# Patient Record
Sex: Male | Born: 1975
Health system: Southern US, Community
[De-identification: ages and names within clinical notes are randomized; demographics above are authoritative.]

---

## 2003-03-07 ENCOUNTER — Encounter: Payer: Self-pay | Admitting: Internal Medicine

## 2003-03-07 ENCOUNTER — Ambulatory Visit (HOSPITAL_COMMUNITY): Admission: RE | Admit: 2003-03-07 | Discharge: 2003-03-07 | Payer: Self-pay | Admitting: Internal Medicine

## 2003-04-03 ENCOUNTER — Emergency Department (HOSPITAL_COMMUNITY): Admission: EM | Admit: 2003-04-03 | Discharge: 2003-04-03 | Payer: Self-pay | Admitting: *Deleted

## 2006-04-01 ENCOUNTER — Ambulatory Visit (HOSPITAL_COMMUNITY): Admission: EM | Admit: 2006-04-01 | Discharge: 2006-04-01 | Payer: Self-pay | Admitting: Emergency Medicine

## 2011-12-23 ENCOUNTER — Other Ambulatory Visit: Payer: Self-pay

## 2011-12-23 ENCOUNTER — Encounter (HOSPITAL_COMMUNITY): Payer: Self-pay | Admitting: Emergency Medicine

## 2011-12-23 ENCOUNTER — Emergency Department (HOSPITAL_COMMUNITY)
Admission: EM | Admit: 2011-12-23 | Discharge: 2011-12-23 | Disposition: A | Discharge status: Home / Self Care | Payer: BC Managed Care – PPO | Attending: Emergency Medicine | Admitting: Emergency Medicine

## 2011-12-23 DIAGNOSIS — K5289 Other specified noninfective gastroenteritis and colitis: Secondary | ICD-10-CM | POA: Insufficient documentation

## 2011-12-23 DIAGNOSIS — R197 Diarrhea, unspecified: Secondary | ICD-10-CM | POA: Insufficient documentation

## 2011-12-23 DIAGNOSIS — K529 Noninfective gastroenteritis and colitis, unspecified: Secondary | ICD-10-CM

## 2011-12-23 DIAGNOSIS — R000 Tachycardia, unspecified: Secondary | ICD-10-CM | POA: Insufficient documentation

## 2011-12-23 DIAGNOSIS — R112 Nausea with vomiting, unspecified: Secondary | ICD-10-CM

## 2011-12-23 LAB — URINALYSIS, ROUTINE W REFLEX MICROSCOPIC
Hgb urine dipstick: NEGATIVE
Leukocytes, UA: NEGATIVE
Nitrite: NEGATIVE
Protein, ur: NEGATIVE mg/dL
Specific Gravity, Urine: 1.015 (ref 1.005–1.030)
Urobilinogen, UA: 0.2 mg/dL (ref 0.0–1.0)

## 2011-12-23 LAB — COMPREHENSIVE METABOLIC PANEL
AST: 20 U/L (ref 0–37)
BUN: 17 mg/dL (ref 6–23)
CO2: 25 mEq/L (ref 19–32)
Calcium: 9.5 mg/dL (ref 8.4–10.5)
Creatinine, Ser: 0.98 mg/dL (ref 0.50–1.35)
GFR calc Af Amer: >90 mL/min (ref >90)
GFR calc non Af Amer: >90 mL/min (ref >90)
Glucose, Bld: 120 mg/dL — ABNORMAL HIGH (ref 70–99)

## 2011-12-23 LAB — CBC
MCH: 30.6 pg (ref 26.0–34.0)
MCV: 87.1 fL (ref 78.0–100.0)
Platelets: 219 10*3/uL (ref 150–400)
RBC: 5.26 MIL/uL (ref 4.22–5.81)

## 2011-12-23 LAB — LIPASE, BLOOD: Lipase: 18 U/L (ref 11–59)

## 2011-12-23 MED ORDER — ONDANSETRON HCL 4 MG/2ML IJ SOLN
4.0000 mg | Freq: Once | INTRAMUSCULAR | Status: AC
  Administered 2011-12-23: 4 mg via INTRAVENOUS
  Filled 2011-12-23: qty 2

## 2011-12-23 MED ORDER — KETOROLAC TROMETHAMINE 30 MG/ML IJ SOLN
30.0000 mg | Freq: Once | INTRAMUSCULAR | Status: AC
  Administered 2011-12-23: 30 mg via INTRAVENOUS
  Filled 2011-12-23: qty 1

## 2011-12-23 MED ORDER — LOPERAMIDE HCL 2 MG PO CAPS
ORAL_CAPSULE | ORAL | Status: AC
  Filled 2011-12-23: qty 2

## 2011-12-23 MED ORDER — LOPERAMIDE HCL 2 MG PO CAPS
4.0000 mg | ORAL_CAPSULE | Freq: Once | ORAL | Status: AC
  Administered 2011-12-23: 4 mg via ORAL

## 2011-12-23 MED ORDER — SODIUM CHLORIDE 0.9 % IV BOLUS (SEPSIS)
1000.0000 mL | Freq: Once | INTRAVENOUS | Status: AC
  Administered 2011-12-23: 1000 mL via INTRAVENOUS

## 2011-12-23 MED ORDER — SODIUM CHLORIDE 0.9 % IV BOLUS (SEPSIS): 1000.0000 mL | Freq: Once | INTRAVENOUS | Status: DC

## 2011-12-23 MED ORDER — ACETAMINOPHEN 325 MG PO TABS
650.0000 mg | ORAL_TABLET | Freq: Once | ORAL | Status: AC
  Administered 2011-12-23: 650 mg via ORAL
  Filled 2011-12-23: qty 2

## 2011-12-23 NOTE — ED Provider Notes (Signed)
History     CSN: [id number]  Arrival date & time 12/23/11  1125   First MD Initiated Contact with Patient 12/23/11 1305      Chief Complaint  Patient presents with  . Abdominal Pain  . Nausea  . Emesis  . Loss of Consciousness    (Consider location/radiation/quality/duration/timing/severity/associated sxs/prior treatment) HPI Patient presents with complaint of nausea vomiting and diarrhea. He states that the symptoms began acutely approximately 4 AM today. He had been feeling otherwise well yesterday until acute onset of his symptoms. He denies fever or chills. Emesis was nonbloody and nonbilious and stool is without blood. He states he has had output approximately every 15 minutes. He does describe a fainting episode in the bathroom which resolved spontaneously. He has had no chest pain. He has no specific abdominal pain but generalized discomfort temporarily relieved by vomiting or passing stool. He's had no recent travel or specific sick contacts. He has not been able to keep down any fluids by mouth today. There no other associated systemic symptoms. There no alleviating or modifying factors.  History reviewed. No pertinent past medical history.  History reviewed. No pertinent past surgical history.  No family history on file.  History  Substance Use Topics  . Smoking status: Never Smoker   . Smokeless tobacco: Not on file  . Alcohol Use:      socially      Review of Systems ROS reviewed and otherwise negative except for mentioned in HPI  Allergies  Penicillins  Home Medications  No current outpatient prescriptions on file.  BP 106/61  Pulse 104  Temp(Src) 99.5 F (37.5 C) (Oral)  Resp 18  SpO2 98% Vitals reviewed Physical Exam Physical Examination: General appearance - alert, concerned appearing, and in no distress Mental status - alert, oriented to person, place, and time Eyes - pupils equal and reactive, extraocular eye movements intact Mouth - mucous  membranes moist, pharynx normal without lesions Chest - clear to auscultation, no wheezes, rales or rhonchi, symmetric air entry Heart - normal rate, regular rhythm, normal S1, S2, no murmurs, rubs, clicks or gallops Abdomen - soft, nontender, nondistended, no masses or organomegaly, nabs Extremities - peripheral pulses normal, no pedal edema, no clubbing or cyanosis Skin - normal coloration and turgor, no rashes, brisk cap refill  ED Course  Procedures (including critical care time)  Date: 12/23/2011  Rate: 120  Rhythm: sinus tachycardia  QRS Axis: normal  Intervals: normal  ST/T Wave abnormalities: normal  Conduction Disutrbances:none  Narrative Interpretation:   Old EKG Reviewed: unchanged compared to EKG of 04/01/06  4:14 PM pt has completed 2 liters of NS, he has had no further vomiting in ED. However, continues to have nausea and tachycardia.  BP is stable.  3rd liter NS ordered, repeat zofran, and tylenol for low grade fever.  Will also add toradol.      Labs Reviewed  URINALYSIS, ROUTINE W REFLEX MICROSCOPIC - Abnormal; Notable for the following:    Ketones, ur TRACE (*)    All other components within normal limits  CBC - Abnormal; Notable for the following:    WBC 15.0 (*)    All other components within normal limits  COMPREHENSIVE METABOLIC PANEL - Abnormal; Notable for the following:    Glucose, Bld 120 (*)    All other components within normal limits  LIPASE, BLOOD  LAB REPORT - SCANNED   No results found.   1. Nausea vomiting and diarrhea   2. Gastroenteritis  MDM  Pt presenting with acute onset of nausea/vomiting/diarrhea.  Pt with tachycardia but otherwise nontoxic in appearance on exam.  Pt hydrated with IV fluid boluses, given zofran.  Labs reassuring with the exception of mild increased WBC.  Plan for continued hydration, antiemetics and reassessment to ensure tachycardia is improved prior to discharge.         Ethelda Chick, MD 12/24/11  [phone]

## 2011-12-23 NOTE — ED Provider Notes (Signed)
  Physical Exam  BP 108/69  Pulse 112  Temp(Src) 100.7 F (38.2 C) (Oral)  Resp 18  SpO2 97%  Physical Exam  ED Course  Procedures  MDM  6:38 PM  HR is improved now to 112, temp still slightly up.  No further vomiting, feels improved overally, still having sensation to have diarrhea, still a lot of diarrhea.  Will try imodium and give another liter of IVF's.      abd is soft, HR is down to 105, feel simproved, diarrhea has slowed some.  I informed him tha timodium may need to be used a few more times at home.  Rest, lots of fluids at home.  Will d/c home.    Gavin Pound. Oletta Lamas, MD 12/23/11 2017

## 2011-12-23 NOTE — Discharge Instructions (Signed)
Viral Gastroenteritis Viral gastroenteritis is also known as stomach flu. This condition affects the stomach and intestinal tract. The illness typically lasts 3 to 8 days. Most people develop an immune response. This eventually gets rid of the virus. While this natural response develops, the virus can make you quite ill.  CAUSES  Diarrhea and vomiting are often caused by a virus. Medicines (antibiotics) that kill germs will not help unless there is also a germ (bacterial) infection. SYMPTOMS  The most common symptom is diarrhea. This can cause severe loss of fluids (dehydration) and body salt (electrolyte) imbalance. TREATMENT  Treatments for this illness are aimed at rehydration. Antidiarrheal medicines are not recommended. They do not decrease diarrhea volume and may be harmful. Usually, home treatment is all that is needed. The most serious cases involve vomiting so severely that you are not able to keep down fluids taken by mouth (orally). In these cases, intravenous (IV) fluids are needed. Vomiting with viral gastroenteritis is common, but it will usually go away with treatment. HOME CARE INSTRUCTIONS  Small amounts of fluids should be taken frequently. Large amounts at one time may not be tolerated. Plain water may be harmful in infants and the elderly. Oral rehydration solutions (ORS) are available at pharmacies and grocery stores. ORS replace water and important electrolytes in proper proportions. Sports drinks are not as effective as ORS and may be harmful due to sugars worsening diarrhea.   Adults should eat normally while drinking more fluids than usual. Drink small amounts of fluids frequently and increase as tolerated. Drink enough water and fluids to keep your urine clear or pale yellow. Broths, weak decaffeinated tea, lemon-lime soft drinks (allowed to go flat), and ORS replace fluids and electrolytes.   Avoid:   Carbonated drinks.   Juice.   Extremely hot or cold fluids.    Caffeine drinks.   Fatty, greasy foods.   Alcohol.   Tobacco.   Too much intake of anything at one time.   Gelatin desserts.   Probiotics are active cultures of beneficial bacteria. They may lessen the amount and number of diarrheal stools in adults. Probiotics can be found in yogurt with active cultures and in supplements.   Wash your hands well to avoid spreading bacteria and viruses.   Only take over-the-counter or prescription medicines for pain, discomfort, or fever as directed by your caregiver.   For adults with dehydration, ask your caregiver if you should continue all prescribed and over-the-counter medicines.   If your caregiver has given you a follow-up appointment, it is very important to keep that appointment. Not keeping the appointment could result in a lasting (chronic) or permanent injury and disability. If there is any problem keeping the appointment, you must call to reschedule.  SEEK IMMEDIATE MEDICAL CARE IF:   You are unable to keep fluids down.   There is no urine output in 6 to 8 hours or there is only a small amount of very dark urine.   You develop shortness of breath.   There is blood in the vomit (may look like coffee grounds) or stool.   Belly (abdominal) pain develops, increases, or localizes.   There is persistent vomiting or diarrhea.  MAKE SURE YOU:   Understand these instructions.   Will watch your condition.   Will get help right away if you are not doing well or get worse.  Document Released: 10/12/2005 Document Revised: 06/24/2011 Document Reviewed: 02/23/2007 [facility] Patient Information 2012 [place], [place].

## 2011-12-23 NOTE — ED Notes (Signed)
Pt presenting to ed with c/o abdominal pain, nausea, vomiting and diarrhea onset 4:00am pt's family states that pt was also confused this morning. Pt is alert and oriented at this time

## 2011-12-23 NOTE — ED Notes (Signed)
Patient voided but mistakenly obtain a stool specimen instead of a urine specimen. Patient aware of need for urine testing. Patient unable to void at this time. Patient encouraged to call for toileting assistance. Stool specimen at bedside.

## 2016-11-23 ENCOUNTER — Other Ambulatory Visit: Payer: Self-pay | Admitting: Physician Assistant

## 2016-11-23 DIAGNOSIS — R51 Headache: Principal | ICD-10-CM

## 2016-11-23 DIAGNOSIS — R519 Headache, unspecified: Secondary | ICD-10-CM

## 2016-11-27 ENCOUNTER — Ambulatory Visit
Admission: RE | Admit: 2016-11-27 | Discharge: 2016-11-27 | Disposition: A | Discharge status: Home / Self Care | Payer: BLUE CROSS/BLUE SHIELD | Source: Ambulatory Visit | Attending: Physician Assistant | Admitting: Physician Assistant

## 2016-11-27 DIAGNOSIS — R51 Headache: Principal | ICD-10-CM

## 2016-11-27 DIAGNOSIS — R519 Headache, unspecified: Secondary | ICD-10-CM

## 2016-11-27 MED ORDER — GADOBENATE DIMEGLUMINE 529 MG/ML IV SOLN: 15.0000 mL | Freq: Once | INTRAVENOUS | Status: DC | PRN

## 2017-07-15 DIAGNOSIS — M9901 Segmental and somatic dysfunction of cervical region: Secondary | ICD-10-CM | POA: Diagnosis not present

## 2017-07-15 DIAGNOSIS — M4003 Postural kyphosis, cervicothoracic region: Secondary | ICD-10-CM | POA: Diagnosis not present

## 2017-07-15 DIAGNOSIS — M9902 Segmental and somatic dysfunction of thoracic region: Secondary | ICD-10-CM | POA: Diagnosis not present

## 2017-08-19 DIAGNOSIS — M9901 Segmental and somatic dysfunction of cervical region: Secondary | ICD-10-CM | POA: Diagnosis not present

## 2017-08-19 DIAGNOSIS — M4003 Postural kyphosis, cervicothoracic region: Secondary | ICD-10-CM | POA: Diagnosis not present

## 2017-08-19 DIAGNOSIS — M9902 Segmental and somatic dysfunction of thoracic region: Secondary | ICD-10-CM | POA: Diagnosis not present

## 2017-08-20 DIAGNOSIS — Z0000 Encounter for general adult medical examination without abnormal findings: Secondary | ICD-10-CM | POA: Diagnosis not present

## 2017-08-20 DIAGNOSIS — Z13220 Encounter for screening for lipoid disorders: Secondary | ICD-10-CM | POA: Diagnosis not present

## 2017-09-23 DIAGNOSIS — M9902 Segmental and somatic dysfunction of thoracic region: Secondary | ICD-10-CM | POA: Diagnosis not present

## 2017-09-23 DIAGNOSIS — M4003 Postural kyphosis, cervicothoracic region: Secondary | ICD-10-CM | POA: Diagnosis not present

## 2017-09-23 DIAGNOSIS — M9901 Segmental and somatic dysfunction of cervical region: Secondary | ICD-10-CM | POA: Diagnosis not present

## 2017-11-11 DIAGNOSIS — M4003 Postural kyphosis, cervicothoracic region: Secondary | ICD-10-CM | POA: Diagnosis not present

## 2017-11-11 DIAGNOSIS — M9902 Segmental and somatic dysfunction of thoracic region: Secondary | ICD-10-CM | POA: Diagnosis not present

## 2017-11-11 DIAGNOSIS — M9901 Segmental and somatic dysfunction of cervical region: Secondary | ICD-10-CM | POA: Diagnosis not present

## 2017-12-30 DIAGNOSIS — M4003 Postural kyphosis, cervicothoracic region: Secondary | ICD-10-CM | POA: Diagnosis not present

## 2017-12-30 DIAGNOSIS — M9901 Segmental and somatic dysfunction of cervical region: Secondary | ICD-10-CM | POA: Diagnosis not present

## 2017-12-30 DIAGNOSIS — M9902 Segmental and somatic dysfunction of thoracic region: Secondary | ICD-10-CM | POA: Diagnosis not present

## 2018-01-06 DIAGNOSIS — Z713 Dietary counseling and surveillance: Secondary | ICD-10-CM | POA: Diagnosis not present

## 2018-01-13 DIAGNOSIS — J069 Acute upper respiratory infection, unspecified: Secondary | ICD-10-CM | POA: Diagnosis not present

## 2018-01-20 DIAGNOSIS — Z713 Dietary counseling and surveillance: Secondary | ICD-10-CM | POA: Diagnosis not present

## 2018-02-03 DIAGNOSIS — Z713 Dietary counseling and surveillance: Secondary | ICD-10-CM | POA: Diagnosis not present

## 2018-02-03 DIAGNOSIS — M9903 Segmental and somatic dysfunction of lumbar region: Secondary | ICD-10-CM | POA: Diagnosis not present

## 2018-02-03 DIAGNOSIS — M4003 Postural kyphosis, cervicothoracic region: Secondary | ICD-10-CM | POA: Diagnosis not present

## 2018-02-03 DIAGNOSIS — M9901 Segmental and somatic dysfunction of cervical region: Secondary | ICD-10-CM | POA: Diagnosis not present

## 2018-02-03 DIAGNOSIS — M9902 Segmental and somatic dysfunction of thoracic region: Secondary | ICD-10-CM | POA: Diagnosis not present

## 2018-02-17 DIAGNOSIS — Z713 Dietary counseling and surveillance: Secondary | ICD-10-CM | POA: Diagnosis not present

## 2018-03-10 DIAGNOSIS — Z713 Dietary counseling and surveillance: Secondary | ICD-10-CM | POA: Diagnosis not present

## 2018-03-17 DIAGNOSIS — M4003 Postural kyphosis, cervicothoracic region: Secondary | ICD-10-CM | POA: Diagnosis not present

## 2018-03-17 DIAGNOSIS — M9901 Segmental and somatic dysfunction of cervical region: Secondary | ICD-10-CM | POA: Diagnosis not present

## 2018-03-17 DIAGNOSIS — M9902 Segmental and somatic dysfunction of thoracic region: Secondary | ICD-10-CM | POA: Diagnosis not present

## 2018-03-17 DIAGNOSIS — M9903 Segmental and somatic dysfunction of lumbar region: Secondary | ICD-10-CM | POA: Diagnosis not present

## 2018-03-31 DIAGNOSIS — Z713 Dietary counseling and surveillance: Secondary | ICD-10-CM | POA: Diagnosis not present

## 2018-05-05 DIAGNOSIS — M9901 Segmental and somatic dysfunction of cervical region: Secondary | ICD-10-CM | POA: Diagnosis not present

## 2018-05-05 DIAGNOSIS — M4003 Postural kyphosis, cervicothoracic region: Secondary | ICD-10-CM | POA: Diagnosis not present

## 2018-05-05 DIAGNOSIS — M9903 Segmental and somatic dysfunction of lumbar region: Secondary | ICD-10-CM | POA: Diagnosis not present

## 2018-05-05 DIAGNOSIS — M9902 Segmental and somatic dysfunction of thoracic region: Secondary | ICD-10-CM | POA: Diagnosis not present

## 2018-05-12 DIAGNOSIS — Z713 Dietary counseling and surveillance: Secondary | ICD-10-CM | POA: Diagnosis not present

## 2018-06-02 DIAGNOSIS — M9903 Segmental and somatic dysfunction of lumbar region: Secondary | ICD-10-CM | POA: Diagnosis not present

## 2018-06-02 DIAGNOSIS — M9901 Segmental and somatic dysfunction of cervical region: Secondary | ICD-10-CM | POA: Diagnosis not present

## 2018-06-02 DIAGNOSIS — M9902 Segmental and somatic dysfunction of thoracic region: Secondary | ICD-10-CM | POA: Diagnosis not present

## 2018-06-02 DIAGNOSIS — M4003 Postural kyphosis, cervicothoracic region: Secondary | ICD-10-CM | POA: Diagnosis not present

## 2018-06-23 DIAGNOSIS — Z713 Dietary counseling and surveillance: Secondary | ICD-10-CM | POA: Diagnosis not present

## 2018-07-07 DIAGNOSIS — M4003 Postural kyphosis, cervicothoracic region: Secondary | ICD-10-CM | POA: Diagnosis not present

## 2018-07-07 DIAGNOSIS — M9903 Segmental and somatic dysfunction of lumbar region: Secondary | ICD-10-CM | POA: Diagnosis not present

## 2018-07-07 DIAGNOSIS — M9901 Segmental and somatic dysfunction of cervical region: Secondary | ICD-10-CM | POA: Diagnosis not present

## 2018-07-07 DIAGNOSIS — M9902 Segmental and somatic dysfunction of thoracic region: Secondary | ICD-10-CM | POA: Diagnosis not present

## 2018-08-02 DIAGNOSIS — Z713 Dietary counseling and surveillance: Secondary | ICD-10-CM | POA: Diagnosis not present

## 2018-08-25 DIAGNOSIS — E782 Mixed hyperlipidemia: Secondary | ICD-10-CM | POA: Diagnosis not present

## 2018-08-25 DIAGNOSIS — Z0000 Encounter for general adult medical examination without abnormal findings: Secondary | ICD-10-CM | POA: Diagnosis not present

## 2018-09-08 DIAGNOSIS — Z713 Dietary counseling and surveillance: Secondary | ICD-10-CM | POA: Diagnosis not present

## 2018-09-15 DIAGNOSIS — H5712 Ocular pain, left eye: Secondary | ICD-10-CM | POA: Diagnosis not present

## 2018-10-11 DIAGNOSIS — Z713 Dietary counseling and surveillance: Secondary | ICD-10-CM | POA: Diagnosis not present

## 2018-11-17 DIAGNOSIS — M9902 Segmental and somatic dysfunction of thoracic region: Secondary | ICD-10-CM | POA: Diagnosis not present

## 2018-11-17 DIAGNOSIS — M9903 Segmental and somatic dysfunction of lumbar region: Secondary | ICD-10-CM | POA: Diagnosis not present

## 2018-11-17 DIAGNOSIS — M4003 Postural kyphosis, cervicothoracic region: Secondary | ICD-10-CM | POA: Diagnosis not present

## 2018-11-17 DIAGNOSIS — M9901 Segmental and somatic dysfunction of cervical region: Secondary | ICD-10-CM | POA: Diagnosis not present

## 2018-11-24 DIAGNOSIS — Z713 Dietary counseling and surveillance: Secondary | ICD-10-CM | POA: Diagnosis not present

## 2018-12-08 DIAGNOSIS — K635 Polyp of colon: Secondary | ICD-10-CM | POA: Diagnosis not present

## 2018-12-08 DIAGNOSIS — Z1211 Encounter for screening for malignant neoplasm of colon: Secondary | ICD-10-CM | POA: Diagnosis not present

## 2018-12-08 DIAGNOSIS — Z800 Family history of malignant neoplasm of digestive organs: Secondary | ICD-10-CM | POA: Diagnosis not present

## 2018-12-13 DIAGNOSIS — K635 Polyp of colon: Secondary | ICD-10-CM | POA: Diagnosis not present

## 2018-12-13 DIAGNOSIS — Z1211 Encounter for screening for malignant neoplasm of colon: Secondary | ICD-10-CM | POA: Diagnosis not present

## 2019-01-12 DIAGNOSIS — Z713 Dietary counseling and surveillance: Secondary | ICD-10-CM | POA: Diagnosis not present

## 2019-04-04 DIAGNOSIS — J029 Acute pharyngitis, unspecified: Secondary | ICD-10-CM | POA: Diagnosis not present

## 2019-04-04 DIAGNOSIS — R509 Fever, unspecified: Secondary | ICD-10-CM | POA: Diagnosis not present

## 2019-04-05 DIAGNOSIS — R509 Fever, unspecified: Secondary | ICD-10-CM | POA: Diagnosis not present

## 2019-04-05 DIAGNOSIS — J029 Acute pharyngitis, unspecified: Secondary | ICD-10-CM | POA: Diagnosis not present

## 2019-04-07 DIAGNOSIS — J029 Acute pharyngitis, unspecified: Secondary | ICD-10-CM | POA: Diagnosis not present

## 2019-04-07 DIAGNOSIS — R509 Fever, unspecified: Secondary | ICD-10-CM | POA: Diagnosis not present

## 2019-04-16 ENCOUNTER — Emergency Department (HOSPITAL_COMMUNITY)
Admission: EM | Admit: 2019-04-16 | Discharge: 2019-04-16 | Disposition: A | Discharge status: Home / Self Care | Payer: BC Managed Care – PPO | Attending: Emergency Medicine | Admitting: Emergency Medicine

## 2019-04-16 ENCOUNTER — Emergency Department (HOSPITAL_COMMUNITY): Payer: BC Managed Care – PPO

## 2019-04-16 ENCOUNTER — Other Ambulatory Visit: Payer: Self-pay

## 2019-04-16 ENCOUNTER — Encounter (HOSPITAL_COMMUNITY): Payer: Self-pay | Admitting: Emergency Medicine

## 2019-04-16 DIAGNOSIS — N132 Hydronephrosis with renal and ureteral calculous obstruction: Secondary | ICD-10-CM | POA: Diagnosis not present

## 2019-04-16 DIAGNOSIS — N201 Calculus of ureter: Secondary | ICD-10-CM | POA: Diagnosis not present

## 2019-04-16 DIAGNOSIS — Z79899 Other long term (current) drug therapy: Secondary | ICD-10-CM | POA: Insufficient documentation

## 2019-04-16 DIAGNOSIS — N211 Calculus in urethra: Secondary | ICD-10-CM | POA: Diagnosis not present

## 2019-04-16 DIAGNOSIS — R109 Unspecified abdominal pain: Secondary | ICD-10-CM | POA: Diagnosis not present

## 2019-04-16 LAB — URINALYSIS, ROUTINE W REFLEX MICROSCOPIC
Bacteria, UA: NONE SEEN
Bilirubin Urine: NEGATIVE
Glucose, UA: NEGATIVE mg/dL
Ketones, ur: NEGATIVE mg/dL
Leukocytes,Ua: NEGATIVE
Nitrite: NEGATIVE
Protein, ur: NEGATIVE mg/dL
RBC / HPF: >50 RBC/hpf — ABNORMAL HIGH (ref 0–5)
Specific Gravity, Urine: 1.017 (ref 1.005–1.030)
pH: 5 (ref 5.0–8.0)

## 2019-04-16 LAB — COMPREHENSIVE METABOLIC PANEL
ALT: 40 U/L (ref 0–44)
AST: 24 U/L (ref 15–41)
Albumin: 4.3 g/dL (ref 3.5–5.0)
Alkaline Phosphatase: 82 U/L (ref 38–126)
Anion gap: 13 (ref 5–15)
BUN: 22 mg/dL — ABNORMAL HIGH (ref 6–20)
CO2: 24 mmol/L (ref 22–32)
Calcium: 9.6 mg/dL (ref 8.9–10.3)
Chloride: 100 mmol/L (ref 98–111)
Creatinine, Ser: 1.1 mg/dL (ref 0.61–1.24)
GFR calc Af Amer: >60 mL/min (ref >60)
GFR calc non Af Amer: >60 mL/min (ref >60)
Glucose, Bld: 109 mg/dL — ABNORMAL HIGH (ref 70–99)
Potassium: 3.8 mmol/L (ref 3.5–5.1)
Sodium: 137 mmol/L (ref 135–145)
Total Bilirubin: 0.7 mg/dL (ref 0.3–1.2)
Total Protein: 7.5 g/dL (ref 6.5–8.1)

## 2019-04-16 LAB — CBC WITH DIFFERENTIAL/PLATELET
Abs Immature Granulocytes: 0.44 10*3/uL — ABNORMAL HIGH (ref 0.00–0.07)
Basophils Absolute: 0.1 10*3/uL (ref 0.0–0.1)
Basophils Relative: 1 %
Eosinophils Absolute: 0 10*3/uL (ref 0.0–0.5)
Eosinophils Relative: 0 %
HCT: 49.2 % (ref 39.0–52.0)
Hemoglobin: 16.3 g/dL (ref 13.0–17.0)
Immature Granulocytes: 3 %
Lymphocytes Relative: 26 %
Lymphs Abs: 3.4 10*3/uL (ref 0.7–4.0)
MCH: 30.7 pg (ref 26.0–34.0)
MCHC: 33.1 g/dL (ref 30.0–36.0)
MCV: 92.7 fL (ref 80.0–100.0)
Monocytes Absolute: 1.4 10*3/uL — ABNORMAL HIGH (ref 0.1–1.0)
Monocytes Relative: 10 %
Neutro Abs: 7.9 10*3/uL — ABNORMAL HIGH (ref 1.7–7.7)
Neutrophils Relative %: 60 %
Platelets: 265 10*3/uL (ref 150–400)
RBC: 5.31 MIL/uL (ref 4.22–5.81)
RDW: 12.6 % (ref 11.5–15.5)
WBC: 13.3 10*3/uL — ABNORMAL HIGH (ref 4.0–10.5)
nRBC: 0 % (ref 0.0–0.2)

## 2019-04-16 LAB — LIPASE, BLOOD: Lipase: 33 U/L (ref 11–51)

## 2019-04-16 MED ORDER — PROMETHAZINE HCL 25 MG/ML IJ SOLN
12.5000 mg | Freq: Once | INTRAMUSCULAR | Status: AC
  Administered 2019-04-16: 12.5 mg via INTRAVENOUS
  Filled 2019-04-16: qty 1

## 2019-04-16 MED ORDER — SODIUM CHLORIDE 0.9 % IV BOLUS
1000.0000 mL | Freq: Once | INTRAVENOUS | Status: AC
  Administered 2019-04-16: 1000 mL via INTRAVENOUS

## 2019-04-16 MED ORDER — ONDANSETRON HCL 4 MG/2ML IJ SOLN
INTRAMUSCULAR | Status: AC
  Administered 2019-04-16: 10:00:00
  Filled 2019-04-16: qty 2

## 2019-04-16 MED ORDER — ONDANSETRON 4 MG PO TBDP: 4.0000 mg | ORAL_TABLET | Freq: Three times a day (TID) | ORAL | 0 refills | Status: AC | PRN

## 2019-04-16 MED ORDER — HYDROCODONE-ACETAMINOPHEN 5-325 MG PO TABS: 1.0000 | ORAL_TABLET | ORAL | 0 refills | Status: AC | PRN

## 2019-04-16 MED ORDER — TAMSULOSIN HCL 0.4 MG PO CAPS: 0.4000 mg | ORAL_CAPSULE | Freq: Every day | ORAL | 0 refills | Status: AC

## 2019-04-16 MED ORDER — KETOROLAC TROMETHAMINE 30 MG/ML IJ SOLN
15.0000 mg | Freq: Once | INTRAMUSCULAR | Status: AC
  Administered 2019-04-16: 15 mg via INTRAVENOUS
  Filled 2019-04-16: qty 1

## 2019-04-16 MED ORDER — HYDROMORPHONE HCL 1 MG/ML IJ SOLN
1.0000 mg | Freq: Once | INTRAMUSCULAR | Status: AC
  Administered 2019-04-16: 1 mg via INTRAVENOUS
  Filled 2019-04-16: qty 1

## 2019-04-16 MED ORDER — SODIUM CHLORIDE 0.9 % IV SOLN
8.0000 mg | Freq: Once | INTRAVENOUS | Status: DC
  Filled 2019-04-16: qty 4

## 2019-04-16 NOTE — ED Notes (Signed)
1st URINE REQUEST MADE 

## 2019-04-16 NOTE — ED Triage Notes (Signed)
Pt c/o right flank pain that radiates around to RLQ and testicle.  Pt reports he is on Doxycycline and steroids for sore throat and intermittent fevers. Reports that he has been negative for all flu, Covid, strep tests.

## 2019-04-16 NOTE — Discharge Instructions (Addendum)
Strain all urine.  Take Motrin as needed as directed for pain.  Take Norco for pain not controlled with Motrin.  Take Zofran as needed as prescribed for nausea and vomiting. Take Flomax daily as prescribed. Follow-up with urology. Return to ER for fever, worsening pain, uncontrolled vomiting, any other concerns.

## 2019-04-16 NOTE — ED Provider Notes (Signed)
[facility] DEPT Provider Note   CSN: [id number] Arrival date & time: 04/16/19  0630    History   Chief Complaint Chief Complaint  Patient presents with  . Flank Pain    HPI Nicolas Cabrera is a 43 y.o. male.     43 year old male with complaint of right flank pain onset 6 AM, sharp, constant.  Pain starts in right testicle and radiates to right flank, associated with nausea and vomiting.  Denies changes in bowel habits, dysuria, frequency, hematuria.  No prior abdominal surgeries, no history of kidney stones.  No other complaints or concerns.     History reviewed. No pertinent past medical history.  There are no active problems to display for this patient.   History reviewed. No pertinent surgical history.      Home Medications    Prior to Admission medications   Medication Sig Start Date End Date Taking? Authorizing Provider  Ascorbic Acid (VITAMIN C) 500 MG CHEW Chew 1,000 mg by mouth daily.   Yes [provider]  azithromycin (ZITHROMAX) 250 MG tablet Take 250-500 mg by mouth See admin instructions. Take 500 mg by mouth on day 1, then take 250 mg daily for 4 days 04/14/19  Yes [provider]  doxycycline (VIBRA-TABS) 100 MG tablet Take 100 mg by mouth 2 (two) times a day. 04/07/19  Yes [provider]  predniSONE (STERAPRED UNI-PAK 48 TAB) 10 MG (48) TBPK tablet Take 20-60 mg by mouth See admin instructions. Taper as directed on package for 12 days 04/07/19  Yes [provider]  HYDROcodone-acetaminophen (NORCO/VICODIN) 5-325 MG tablet Take 1 tablet by mouth every 4 (four) hours as needed. 04/16/19   Tacy Learn, PA-C  ondansetron (ZOFRAN ODT) 4 MG disintegrating tablet Take 1 tablet (4 mg total) by mouth every 8 (eight) hours as needed for nausea or vomiting. 04/16/19   Tacy Learn, PA-C  tamsulosin (FLOMAX) 0.4 MG CAPS capsule Take 1 capsule (0.4 mg total) by mouth daily. 04/16/19   Tacy Learn,  PA-C    Family History No family history on file.  Social History Social History   Tobacco Use  . Smoking status: Never Smoker  . Smokeless tobacco: Never Used  Substance Use Topics  . Alcohol use: Not on file    Comment: socially  . Drug use: No     Allergies   Penicillins   Review of Systems Review of Systems  Constitutional: Positive for diaphoresis. Negative for chills and fever.  Gastrointestinal: Positive for abdominal pain, nausea and vomiting. Negative for abdominal distention, constipation and diarrhea.  Genitourinary: Positive for flank pain and testicular pain. Negative for difficulty urinating, discharge, dysuria, frequency, hematuria, penile pain, penile swelling and scrotal swelling.  Skin: Negative for rash and wound.  Allergic/Immunologic: Negative for immunocompromised state.  Neurological: Negative for weakness.  Psychiatric/Behavioral: Negative for confusion.  All other systems reviewed and are negative.    Physical Exam Updated Vital Signs BP 100/63   Pulse 69   Temp 98.2 F (36.8 C) (Oral)   Resp 20   SpO2 100%   Physical Exam Vitals signs and nursing note reviewed. Exam conducted with a chaperone present.  Constitutional:      General: He is not in acute distress.    Appearance: He is well-developed. He is diaphoretic.     Comments: Rocking in bed, vomiting, appears uncomfortable  HENT:     Head: Normocephalic and atraumatic.     Mouth/Throat:  Mouth: Mucous membranes are moist.  Cardiovascular:     Rate and Rhythm: Normal rate and regular rhythm.     Pulses: Normal pulses.     Heart sounds: Normal heart sounds.  Pulmonary:     Effort: Pulmonary effort is normal.  Abdominal:     Tenderness: There is abdominal tenderness in the right lower quadrant. There is no right CVA tenderness or left CVA tenderness.     Comments: Mild right lower quadrant tenderness, no rebound.  Genitourinary:    Penis: Normal.      Comments: Question  high riding, non tender right testicle Skin:    General: Skin is warm.     Findings: No erythema or rash.  Neurological:     General: No focal deficit present.     Mental Status: He is alert and oriented to person, place, and time.  Psychiatric:        Behavior: Behavior normal.      ED Treatments / Results  Labs (all labs ordered are listed, but only abnormal results are displayed) Labs Reviewed  CBC WITH DIFFERENTIAL/PLATELET - Abnormal; Notable for the following components:      Result Value   WBC 13.3 (*)    Neutro Abs 7.9 (*)    Monocytes Absolute 1.4 (*)    Abs Immature Granulocytes 0.44 (*)    All other components within normal limits  COMPREHENSIVE METABOLIC PANEL - Abnormal; Notable for the following components:   Glucose, Bld 109 (*)    BUN 22 (*)    All other components within normal limits  URINALYSIS, ROUTINE W REFLEX MICROSCOPIC - Abnormal; Notable for the following components:   Hgb urine dipstick LARGE (*)    RBC / HPF >50 (*)    All other components within normal limits  LIPASE, BLOOD    EKG None  Radiology Ct Renal Stone Study  Result Date: 04/16/2019 CLINICAL DATA:  Severe right flank and groin pain since this morning. EXAM: CT ABDOMEN AND PELVIS WITHOUT CONTRAST TECHNIQUE: Multidetector CT imaging of the abdomen and pelvis was performed following the standard protocol without IV contrast. COMPARISON:  None. FINDINGS: Lower chest: No significant pulmonary nodules or acute consolidative airspace disease. Hepatobiliary: Normal liver size. No liver mass. Normal gallbladder with no radiopaque cholelithiasis. No biliary ductal dilatation. Pancreas: Normal, with no mass or duct dilation. Spleen: Normal size spleen. Cystic 1.5 cm inferior splenic lesion, compatible with a benign lesion, probably a lymphangioma. No additional splenic lesions. Adrenals/Urinary Tract: Normal adrenals. Obstructing 3 mm distal right pelvic ureteral stone located approximately 1 cm  above the right ureterovesical junction, with mild right hydroureteronephrosis. No stones within the kidneys. No left hydronephrosis. Normal caliber left ureter. No additional ureteral stones. No contour deforming renal masses. Normal bladder. Stomach/Bowel: Normal non-distended stomach. Normal caliber small bowel with no small bowel wall thickening. Normal appendix. Normal large bowel with no diverticulosis, large bowel wall thickening or pericolonic fat stranding. Vascular/Lymphatic: Normal caliber abdominal aorta. No pathologically enlarged lymph nodes in the abdomen or pelvis. Reproductive: Normal size prostate. Other: No pneumoperitoneum, ascites or focal fluid collection. Musculoskeletal: No aggressive appearing focal osseous lesions. IMPRESSION: Obstructing 3 mm distal right pelvic ureteral stone with mild right hydroureteronephrosis. Electronically Signed   By: Delbert PhenixJason A Poff M.D.   On: 04/16/2019 11:14    Procedures Procedures (including critical care time)  Medications Ordered in ED Medications  HYDROmorphone (DILAUDID) injection 1 mg (1 mg Intravenous Given 04/16/19 1022)  sodium chloride 0.9 % bolus 1,000 mL (  0 mLs Intravenous Stopped 04/16/19 1155)  ondansetron (ZOFRAN) 4 MG/2ML injection (  Given 04/16/19 1022)  promethazine (PHENERGAN) injection 12.5 mg (12.5 mg Intravenous Given 04/16/19 1105)  ketorolac (TORADOL) 30 MG/ML injection 15 mg (15 mg Intravenous Given 04/16/19 1140)     Initial Impression / Assessment and Plan / ED Course  I have reviewed the triage vital signs and the nursing notes.  Pertinent labs & imaging results that were available during my care of the patient were reviewed by me and considered in my medical decision making (see chart for details).  Clinical Course as of Apr 15 1310  Sun Apr 16, 2019  [id number] 43 year old male with complaint of right testicular to right flank pain onset this morning at 6 AM.  Associated with nausea and vomiting.  Patient arrives  emergency room with obvious discomfort, rolling around in the bed with vomiting.  On exam patient has mild right lower quadrant tenderness, no CVA tenderness, nontender high riding right testicle.  CT shows distal right ureteral stone.  Pain controlled with Toradol and patient no longer vomiting.  Patient was discharged with prescription for Zofran, Norco, Flomax, referral to urology.  Given return to ER precautions.   [LM]    Clinical Course User Index [LM] Jeannie FendMurphy, Shawnie Nicole A, PA-C      Final Clinical Impressions(s) / ED Diagnoses   Final diagnoses:  Ureterolithiasis    ED Discharge Orders         Ordered    HYDROcodone-acetaminophen (NORCO/VICODIN) 5-325 MG tablet  Every 4 hours PRN     04/16/19 1309    ondansetron (ZOFRAN ODT) 4 MG disintegrating tablet  Every 8 hours PRN     04/16/19 1309    tamsulosin (FLOMAX) 0.4 MG CAPS capsule  Daily     04/16/19 1309           Alden HippMurphy, Xiara Knisley A, PA-C 04/16/19 1312    Gerhard MunchLockwood, Robert, MD 04/18/19 [phone]

## 2019-05-04 DIAGNOSIS — N201 Calculus of ureter: Secondary | ICD-10-CM | POA: Diagnosis not present

## 2019-05-09 DIAGNOSIS — D225 Melanocytic nevi of trunk: Secondary | ICD-10-CM | POA: Diagnosis not present

## 2019-05-09 DIAGNOSIS — L814 Other melanin hyperpigmentation: Secondary | ICD-10-CM | POA: Diagnosis not present

## 2019-05-09 DIAGNOSIS — L815 Leukoderma, not elsewhere classified: Secondary | ICD-10-CM | POA: Diagnosis not present

## 2019-05-09 DIAGNOSIS — D1801 Hemangioma of skin and subcutaneous tissue: Secondary | ICD-10-CM | POA: Diagnosis not present

## 2019-05-16 DIAGNOSIS — R070 Pain in throat: Secondary | ICD-10-CM | POA: Diagnosis not present

## 2019-05-16 DIAGNOSIS — K219 Gastro-esophageal reflux disease without esophagitis: Secondary | ICD-10-CM | POA: Diagnosis not present

## 2019-06-14 DIAGNOSIS — B370 Candidal stomatitis: Secondary | ICD-10-CM | POA: Diagnosis not present

## 2019-06-27 DIAGNOSIS — R070 Pain in throat: Secondary | ICD-10-CM | POA: Diagnosis not present

## 2019-06-27 DIAGNOSIS — K219 Gastro-esophageal reflux disease without esophagitis: Secondary | ICD-10-CM | POA: Diagnosis not present

## 2019-07-21 IMAGING — CT CT RENAL STONE PROTOCOL
2 of 4 series · 16 of 46 positions shown, 18 images · non-contrast
Comparison: None.

CLINICAL DATA: Severe right flank and groin pain since this
morning.

EXAM:
CT ABDOMEN AND PELVIS WITHOUT CONTRAST
TECHNIQUE: Multidetector CT imaging of the abdomen and pelvis was performed
following the standard protocol without IV contrast.

[Series 2: axial st · axial · 0.79mm/px · z∈[-524,-59]mm · 13 of 107 slices shown, 15 images]
[im 7/107  soft-tissue]
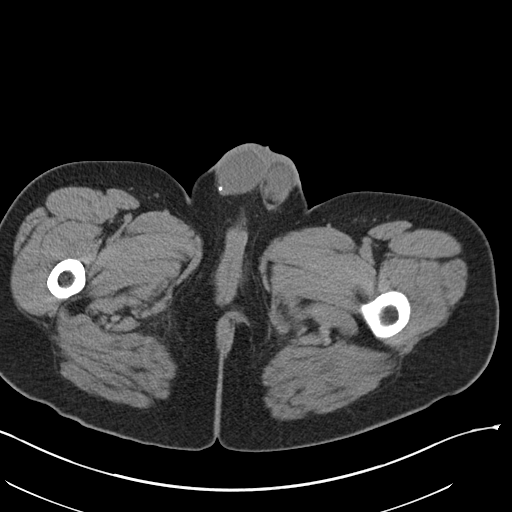
[im 7/107  bone]
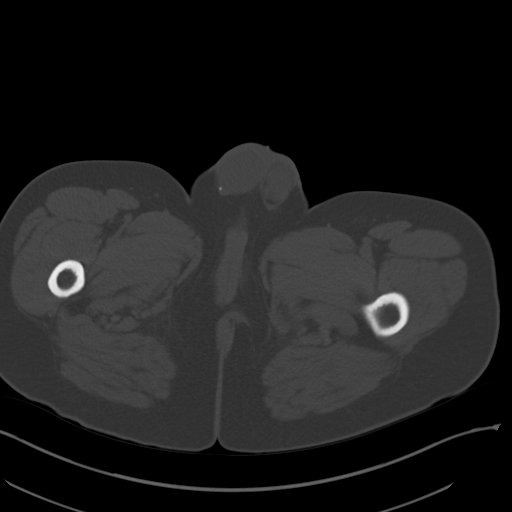
[im 14/107  soft-tissue]
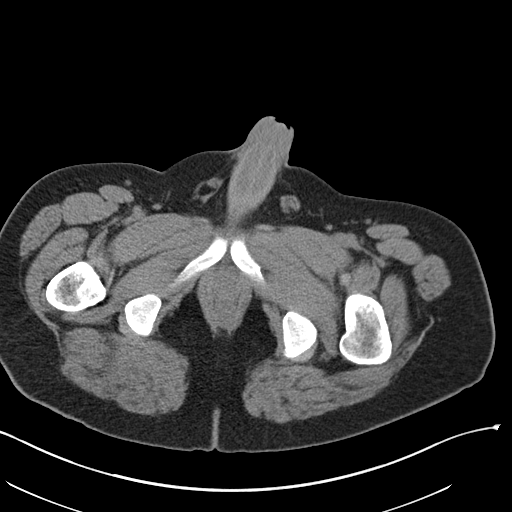
[im 20/107  soft-tissue]
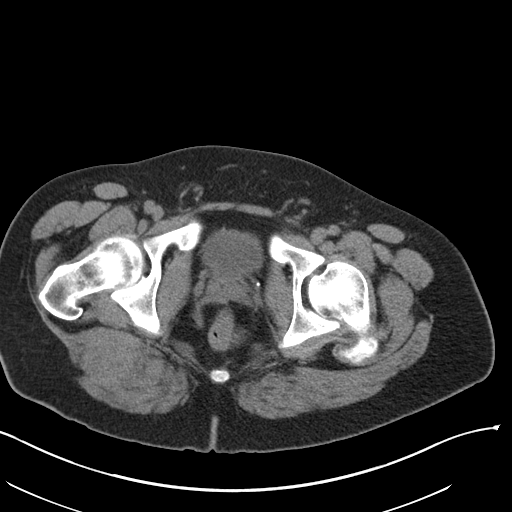
[im 34/107  soft-tissue]
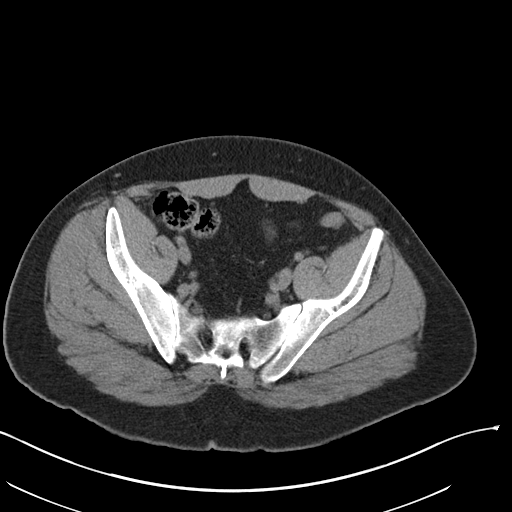
[im 40/107  soft-tissue]
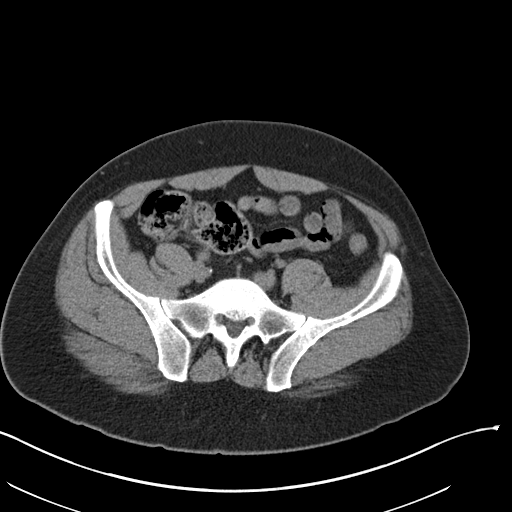
[im 47/107  soft-tissue]
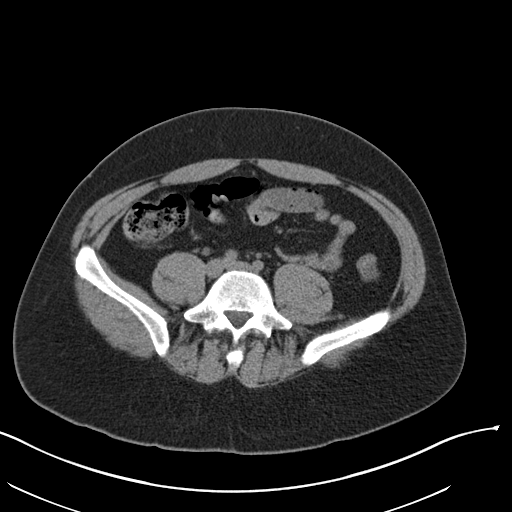
[im 54/107  soft-tissue]
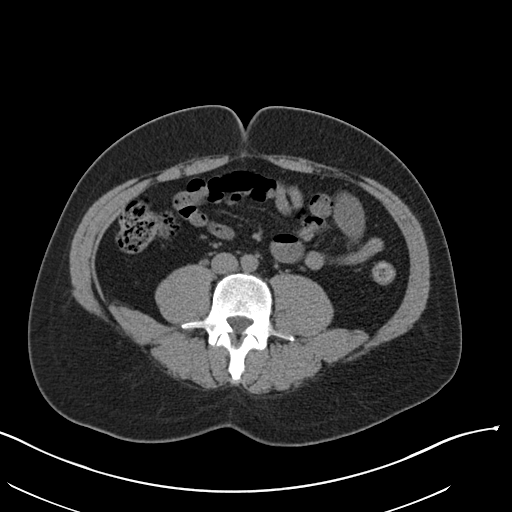
[im 60/107  soft-tissue]
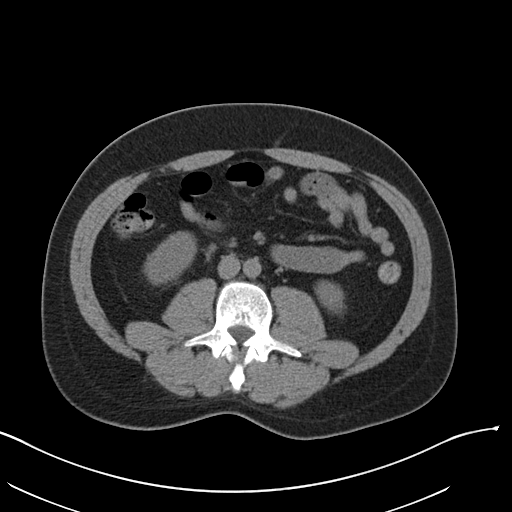
[im 67/107  soft-tissue]
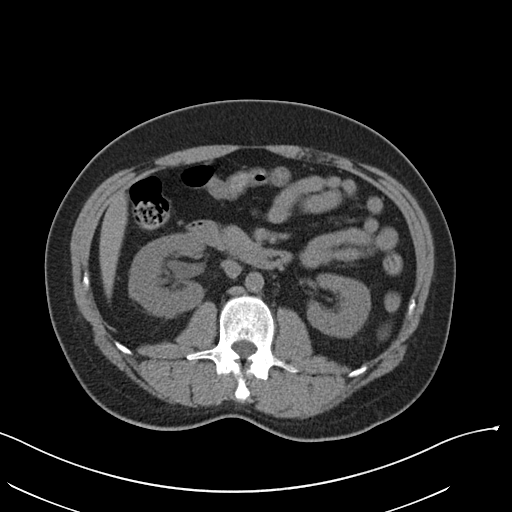
[im 67/107  bone]
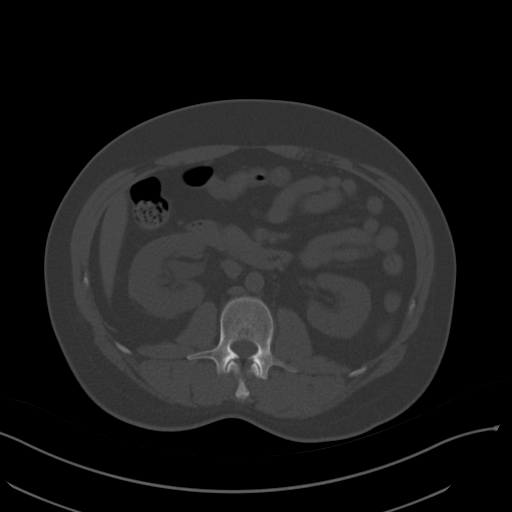
[im 73/107  soft-tissue]
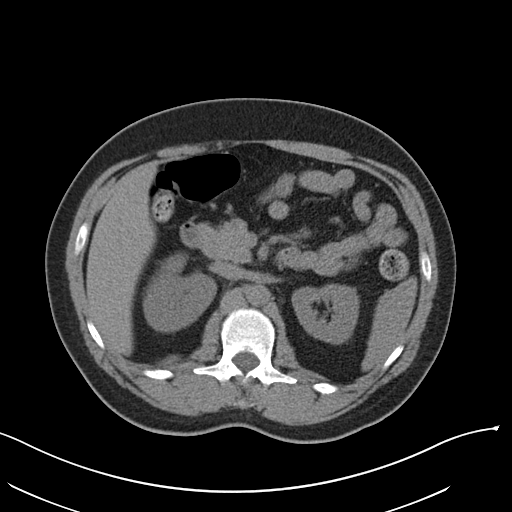
[im 87/107  soft-tissue]
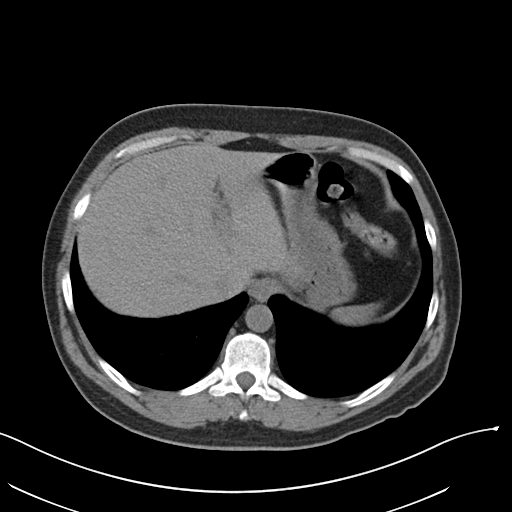
[im 93/107  soft-tissue]
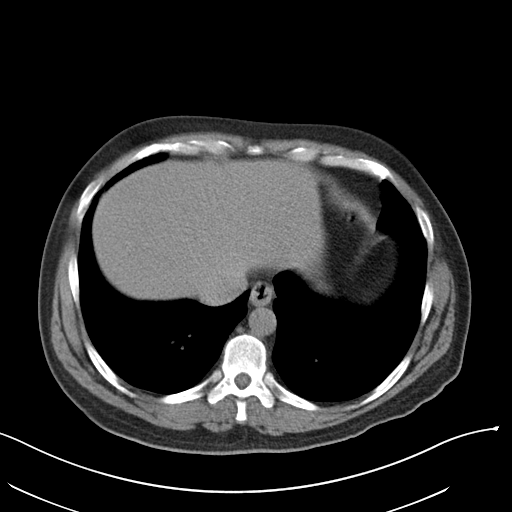
[im 100/107  soft-tissue]
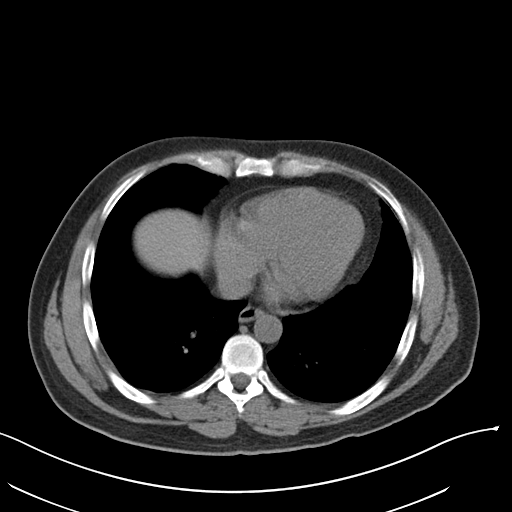

[Series 4: coronal · coronal · 0.92mm/px · 3 of 132 slices shown]
[im 44/132  soft-tissue]
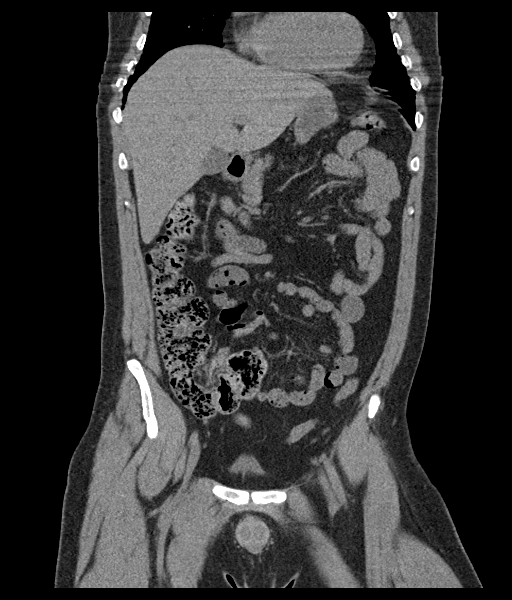
[im 59/132  soft-tissue]
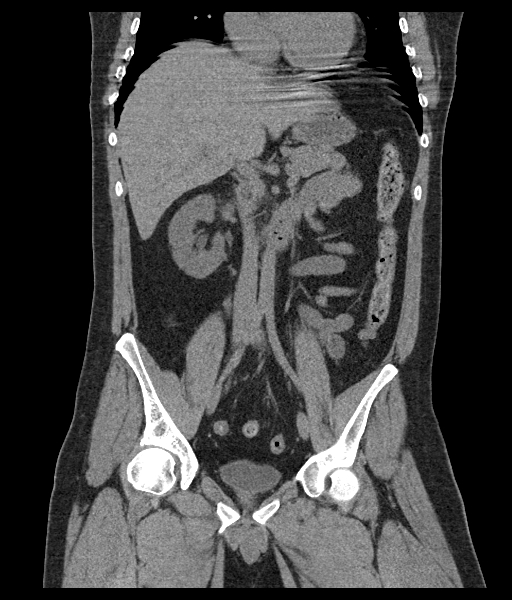
[im 73/132  soft-tissue]
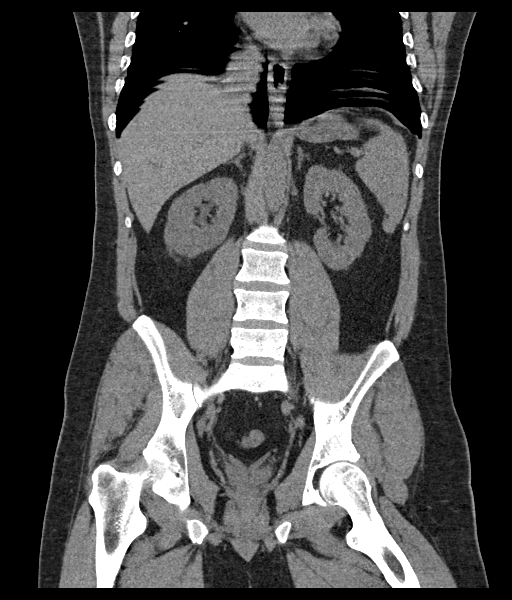

[16 of 46 positions shown; findings below may reference images not displayed]

FINDINGS: Lower chest: No significant pulmonary nodules or acute consolidative
airspace disease.

Hepatobiliary: Normal liver size. No liver mass. Normal gallbladder
with no radiopaque cholelithiasis. No biliary ductal dilatation.

Pancreas: Normal, with no mass or duct dilation.

Spleen: Normal size spleen. Cystic 1.5 cm inferior splenic lesion,
compatible with a benign lesion, probably a lymphangioma. No
additional splenic lesions.

Adrenals/Urinary Tract: Normal adrenals. Obstructing 3 mm distal
right pelvic ureteral stone located approximately 1 cm above the
right ureterovesical junction, with mild right
hydroureteronephrosis. No stones within the kidneys. No left
hydronephrosis. Normal caliber left ureter. No additional ureteral
stones. No contour deforming renal masses. Normal bladder.

Stomach/Bowel: Normal non-distended stomach. Normal caliber small
bowel with no small bowel wall thickening. Normal appendix. Normal
large bowel with no diverticulosis, large bowel wall thickening or
pericolonic fat stranding.

Vascular/Lymphatic: Normal caliber abdominal aorta. No
pathologically enlarged lymph nodes in the abdomen or pelvis.

Reproductive: Normal size prostate.

Other: No pneumoperitoneum, ascites or focal fluid collection.

Musculoskeletal: No aggressive appearing focal osseous lesions.
IMPRESSION: Obstructing 3 mm distal right pelvic ureteral stone with mild right
hydroureteronephrosis.

## 2019-09-05 DIAGNOSIS — K219 Gastro-esophageal reflux disease without esophagitis: Secondary | ICD-10-CM | POA: Diagnosis not present

## 2019-09-05 DIAGNOSIS — R070 Pain in throat: Secondary | ICD-10-CM | POA: Diagnosis not present

## 2019-09-07 DIAGNOSIS — R635 Abnormal weight gain: Secondary | ICD-10-CM | POA: Diagnosis not present

## 2019-09-07 DIAGNOSIS — R739 Hyperglycemia, unspecified: Secondary | ICD-10-CM | POA: Diagnosis not present

## 2019-09-07 DIAGNOSIS — Z23 Encounter for immunization: Secondary | ICD-10-CM | POA: Diagnosis not present

## 2019-09-07 DIAGNOSIS — E782 Mixed hyperlipidemia: Secondary | ICD-10-CM | POA: Diagnosis not present

## 2019-09-07 DIAGNOSIS — Z0000 Encounter for general adult medical examination without abnormal findings: Secondary | ICD-10-CM | POA: Diagnosis not present
